# Patient Record
Sex: Female | Born: 1975 | Race: White | Hispanic: No | Marital: Married | Smoking: Never smoker
Health system: Southern US, Community
[De-identification: ages and names within clinical notes are randomized; demographics above are authoritative.]

## PROBLEM LIST (undated history)

## (undated) DIAGNOSIS — K219 Gastro-esophageal reflux disease without esophagitis: Secondary | ICD-10-CM

## (undated) HISTORY — PX: UPPER GI ENDOSCOPY: SHX6162

---

## 1998-02-06 ENCOUNTER — Inpatient Hospital Stay (HOSPITAL_COMMUNITY): Admission: AD | Admit: 1998-02-06 | Discharge: 1998-02-09 | Payer: Self-pay | Admitting: Obstetrics and Gynecology

## 1999-05-11 ENCOUNTER — Encounter: Payer: Self-pay | Admitting: Internal Medicine

## 1999-05-11 ENCOUNTER — Ambulatory Visit (HOSPITAL_COMMUNITY): Admission: RE | Admit: 1999-05-11 | Discharge: 1999-05-11 | Payer: Self-pay | Admitting: Internal Medicine

## 1999-06-07 ENCOUNTER — Encounter: Payer: Self-pay | Admitting: Gastroenterology

## 1999-06-07 ENCOUNTER — Ambulatory Visit (HOSPITAL_COMMUNITY): Admission: RE | Admit: 1999-06-07 | Discharge: 1999-06-07 | Payer: Self-pay | Admitting: Gastroenterology

## 1999-07-29 ENCOUNTER — Other Ambulatory Visit: Admission: RE | Admit: 1999-07-29 | Discharge: 1999-07-29 | Payer: Self-pay | Admitting: Obstetrics and Gynecology

## 2001-12-03 ENCOUNTER — Other Ambulatory Visit: Admission: RE | Admit: 2001-12-03 | Discharge: 2001-12-03 | Payer: Self-pay | Admitting: Family Medicine

## 2005-09-03 ENCOUNTER — Inpatient Hospital Stay: Payer: Self-pay | Admitting: Unknown Physician Specialty

## 2007-03-09 ENCOUNTER — Ambulatory Visit: Payer: Self-pay

## 2007-07-28 ENCOUNTER — Inpatient Hospital Stay: Payer: Self-pay | Admitting: Obstetrics and Gynecology

## 2007-11-15 HISTORY — PX: CHOLECYSTECTOMY: SHX55

## 2008-08-13 ENCOUNTER — Ambulatory Visit: Payer: Self-pay | Admitting: Family Medicine

## 2008-10-22 ENCOUNTER — Ambulatory Visit: Payer: Self-pay | Admitting: Gastroenterology

## 2008-10-27 ENCOUNTER — Ambulatory Visit: Payer: Self-pay | Admitting: Surgery

## 2008-10-29 ENCOUNTER — Ambulatory Visit: Payer: Self-pay | Admitting: Surgery

## 2009-06-07 ENCOUNTER — Emergency Department: Payer: Self-pay | Admitting: Emergency Medicine

## 2015-11-23 ENCOUNTER — Encounter: Admission: RE | Payer: Self-pay | Source: Ambulatory Visit

## 2015-11-23 ENCOUNTER — Ambulatory Visit: Admission: RE | Admit: 2015-11-23 | Payer: Self-pay | Source: Ambulatory Visit | Admitting: Gastroenterology

## 2015-11-23 HISTORY — DX: Gastro-esophageal reflux disease without esophagitis: K21.9

## 2015-11-23 SURGERY — EGD (ESOPHAGOGASTRODUODENOSCOPY)
Anesthesia: General

## 2017-03-16 ENCOUNTER — Encounter: Payer: Self-pay | Admitting: Certified Nurse Midwife

## 2017-04-11 ENCOUNTER — Encounter: Payer: Self-pay | Admitting: Certified Nurse Midwife

## 2017-04-11 ENCOUNTER — Ambulatory Visit (INDEPENDENT_AMBULATORY_CARE_PROVIDER_SITE_OTHER): Payer: BLUE CROSS/BLUE SHIELD | Admitting: Certified Nurse Midwife

## 2017-04-11 VITALS — BP 108/66 | HR 78 | Ht 61.0 in | Wt 180.0 lb

## 2017-04-11 DIAGNOSIS — Z13220 Encounter for screening for lipoid disorders: Secondary | ICD-10-CM | POA: Diagnosis not present

## 2017-04-11 DIAGNOSIS — Z131 Encounter for screening for diabetes mellitus: Secondary | ICD-10-CM

## 2017-04-11 DIAGNOSIS — Z124 Encounter for screening for malignant neoplasm of cervix: Secondary | ICD-10-CM

## 2017-04-11 DIAGNOSIS — Z1239 Encounter for other screening for malignant neoplasm of breast: Secondary | ICD-10-CM

## 2017-04-11 DIAGNOSIS — Z1231 Encounter for screening mammogram for malignant neoplasm of breast: Secondary | ICD-10-CM | POA: Diagnosis not present

## 2017-04-11 DIAGNOSIS — Z01419 Encounter for gynecological examination (general) (routine) without abnormal findings: Secondary | ICD-10-CM

## 2017-04-11 NOTE — Progress Notes (Signed)
Gynecology Annual Exam  PCP: Patient, No Pcp Per  Chief Complaint:  Chief Complaint  Patient presents with  . Gynecologic Exam    History of Present Illness: Terri Santos is a 41 y.o. [phone] who presents for a NP annual exam. The patient has no complaints today.  Her menses are regular, they occur every month, and they last 7-8 days. Her flow is moderate with 2 heavier days requiring pad change every 2 hours. She does not have intermenstrual bleeding. Her last menstrual period was 04/02/2017. She has dysmenorrhea x 1 day and takes Motrin 600 mgm x1 dose with relief. Last pap smear: 12/18/2006, results were NIL/neg. No history of abnormal Paps The patient is sexually active. She currently uses vasectomy for contraception. She does not have dyspareunia.  She has not been [facility] at [facility] since the delivery of her last baby in 2008.  Her past medical history is remarkable for cholelithiasis,  Cholecystectomy, and GERD.  The patient does perform occasional self breast exams. She has not had a recent mammogram and is eligible.  There is a family history of breast cancer in her MGGM and MGM Genetic testing has not been done.  There is no family history of ovarian cancer.   The patient denies smoking.  She reports drinking alcohol. She reports have 2 drinks per week.  She denies illegal drug use.  The patient does not exercise.   She does get adequate calcium in her diet.  She has not had a recent cholesterol screen and is interested in lab work.  The patient denies current symptoms of depression.    Review of Systems: Review of Systems  Constitutional: Negative for chills, fever and weight loss.  HENT: Negative for congestion, sinus pain and sore throat.   Eyes: Negative for blurred vision and pain.  Respiratory: Negative for hemoptysis, shortness of breath and wheezing.   Cardiovascular: Negative for chest pain, palpitations and leg swelling.  Gastrointestinal:  Positive for heartburn. Negative for abdominal pain, blood in stool, diarrhea, nausea and vomiting.  Genitourinary: Negative for dysuria, frequency, hematuria and urgency.       Positive for menorrhagia  Musculoskeletal: Negative for back pain, joint pain and myalgias.  Skin: Negative for itching and rash.  Neurological: Negative for dizziness, tingling and headaches.  Endo/Heme/Allergies: Negative for environmental allergies and polydipsia. Does not bruise/bleed easily.       Negative for hirsutism   Psychiatric/Behavioral: Negative for depression. The patient is not nervous/anxious and does not have insomnia.     Past Medical History:  Past Medical History:  Diagnosis Date  . GERD (gastroesophageal reflux disease)     Past Surgical History:  Past Surgical History:  Procedure Laterality Date  . CHOLECYSTECTOMY  2009  . UPPER GI ENDOSCOPY     x2    Family History:  Family History  Problem Relation Age of Onset  . Lung cancer Maternal Uncle 30       both maternal uncles ages 3330 & 3070  . Breast cancer Maternal Grandmother 2280       also MGGM same age  . Diabetes Father     Social History:  Social History   Social History  . Marital status: Married    Spouse name: N/A  . Number of children: 7  . Years of education: N/A   Occupational History  . Homemaker    Social History Main Topics  . Smoking status: Never Smoker  . Smokeless tobacco: Never Used  .  Alcohol use 1.2 oz/week    2 Glasses of wine per week  . Drug use: No  . Sexual activity: Yes    Partners: Male    Birth control/ protection: Other-see comments     Comment: vasectomy   Other Topics Concern  . Not on file   Social History Narrative  . No narrative on file    Allergies:  No Known Allergies  Medications: Prior to Admission medications   Medication Sig Start Date End Date Taking? Authorizing Provider  Pseudoephedrine-APAP-DM (DAYQUIL MULTI-SYMPTOM COLD/FLU PO) Take by mouth.   Yes [provider]  Prilosec prn  Physical Exam Vitals: Blood pressure 108/66, pulse 78, height 5\' 1"  (1.549 m), weight 180 lb (81.6 kg),BMI 34.1 kg/m2, last menstrual period 04/02/2017.  General: WF in NAD HEENT: normocephalic, anicteric Neck: no thyroid enlargement, no palpable nodules, no cervical lymphadenopathy  Pulmonary: No increased work of breathing, CTAB Cardiovascular: RRR, without murmur  Breast: Breast symmetrical, no tenderness, no palpable nodules or masses, no skin or nipple retraction present, no nipple discharge.  No axillary, infraclavicular or supraclavicular lymphadenopathy. Abdomen: Soft, non-tender, non-distended.  Umbilicus without lesions.  No hepatomegaly or masses palpable. No evidence of hernia. Genitourinary:  External: Normal external female genitalia.  Normal urethral meatus, normal Bartholin's and Skene's glands.    Vagina: Normal vaginal mucosa, no evidence of prolapse.    Cervix: Grossly normal in appearance, no bleeding, non-tender  Uterus: Anteverted, normal size, shape, and consistency, mobile, and non-tender  Adnexa: No adnexal masses, non-tender  Rectal: deferred  Lymphatic: no evidence of inguinal lymphadenopathy Extremities: no edema, erythema, or tenderness Neurologic: Grossly intact Psychiatric: mood appropriate, affect full     Assessment: 41 y.o. [id number] with normal well woman exam  Plan:  1) Breast cancer screening - recommend monthly self breast exam and annual screening mammograms. Mammogram was ordered today.  2) Cervical cancer screening - Pap was done.   3) Routine healthcare maintenance including cholesterol and diabetes screening ordered today   4) RTO in 1 year and prn  Farrel Conners, CNM

## 2017-04-12 ENCOUNTER — Encounter: Payer: Self-pay | Admitting: Certified Nurse Midwife

## 2017-04-12 LAB — HGB A1C W/O EAG: HEMOGLOBIN A1C: 5.3 % (ref 4.8–5.6)

## 2017-04-12 LAB — LIPID PANEL WITH LDL/HDL RATIO
Cholesterol, Total: 167 mg/dL (ref 100–199)
HDL: 40 mg/dL (ref >39)
LDL Calculated: 102 mg/dL — ABNORMAL HIGH (ref 0–99)
LDL/HDL RATIO: 2.6 ratio (ref 0.0–3.2)
Triglycerides: 125 mg/dL (ref 0–149)
VLDL Cholesterol Cal: 25 mg/dL (ref 5–40)

## 2017-04-14 LAB — IGP, APTIMA HPV
HPV APTIMA: NEGATIVE
PAP Smear Comment: 0

## 2017-04-15 ENCOUNTER — Other Ambulatory Visit: Payer: Self-pay | Admitting: Certified Nurse Midwife

## 2017-04-16 ENCOUNTER — Encounter: Payer: Self-pay | Admitting: Certified Nurse Midwife

## 2017-04-16 DIAGNOSIS — K219 Gastro-esophageal reflux disease without esophagitis: Secondary | ICD-10-CM | POA: Insufficient documentation

## 2020-06-30 ENCOUNTER — Other Ambulatory Visit
Admission: RE | Admit: 2020-06-30 | Discharge: 2020-06-30 | Disposition: A | Discharge status: Home / Self Care | Payer: BC Managed Care – PPO | Source: Ambulatory Visit | Attending: Pediatrics | Admitting: Pediatrics

## 2020-06-30 DIAGNOSIS — R0600 Dyspnea, unspecified: Secondary | ICD-10-CM | POA: Diagnosis present

## 2020-06-30 DIAGNOSIS — R079 Chest pain, unspecified: Secondary | ICD-10-CM | POA: Insufficient documentation

## 2020-06-30 LAB — FIBRIN DERIVATIVES D-DIMER (ARMC ONLY): Fibrin derivatives D-dimer (ARMC): 224.34 ng/mL (FEU) (ref 0.00–499.00)

## 2020-08-11 ENCOUNTER — Other Ambulatory Visit: Payer: Self-pay | Admitting: Family Medicine

## 2020-08-11 DIAGNOSIS — Z1231 Encounter for screening mammogram for malignant neoplasm of breast: Secondary | ICD-10-CM

## 2020-08-17 ENCOUNTER — Other Ambulatory Visit: Payer: Self-pay | Admitting: Family Medicine

## 2020-08-17 DIAGNOSIS — Z803 Family history of malignant neoplasm of breast: Secondary | ICD-10-CM

## 2020-08-17 DIAGNOSIS — R079 Chest pain, unspecified: Secondary | ICD-10-CM

## 2020-08-25 ENCOUNTER — Encounter (INDEPENDENT_AMBULATORY_CARE_PROVIDER_SITE_OTHER): Payer: Self-pay

## 2020-08-25 ENCOUNTER — Other Ambulatory Visit: Payer: Self-pay

## 2020-08-25 ENCOUNTER — Ambulatory Visit
Admission: RE | Admit: 2020-08-25 | Discharge: 2020-08-25 | Disposition: A | Discharge status: Home / Self Care | Payer: BC Managed Care – PPO | Source: Ambulatory Visit | Attending: Family Medicine | Admitting: Family Medicine

## 2020-08-25 DIAGNOSIS — Z803 Family history of malignant neoplasm of breast: Secondary | ICD-10-CM | POA: Diagnosis present

## 2020-08-25 DIAGNOSIS — R079 Chest pain, unspecified: Secondary | ICD-10-CM | POA: Diagnosis not present

## 2020-09-10 ENCOUNTER — Other Ambulatory Visit: Payer: Self-pay

## 2020-09-10 ENCOUNTER — Ambulatory Visit
Admission: RE | Admit: 2020-09-10 | Discharge: 2020-09-10 | Disposition: A | Discharge status: Home / Self Care | Payer: BC Managed Care – PPO | Source: Ambulatory Visit | Attending: Family Medicine | Admitting: Family Medicine

## 2020-09-10 DIAGNOSIS — Z1231 Encounter for screening mammogram for malignant neoplasm of breast: Secondary | ICD-10-CM | POA: Insufficient documentation

## 2020-09-14 ENCOUNTER — Other Ambulatory Visit: Payer: Self-pay | Admitting: Family Medicine

## 2020-09-16 ENCOUNTER — Other Ambulatory Visit: Payer: Self-pay | Admitting: Family Medicine

## 2020-09-16 DIAGNOSIS — N6320 Unspecified lump in the left breast, unspecified quadrant: Secondary | ICD-10-CM

## 2020-09-16 DIAGNOSIS — R928 Other abnormal and inconclusive findings on diagnostic imaging of breast: Secondary | ICD-10-CM

## 2020-09-16 DIAGNOSIS — N6489 Other specified disorders of breast: Secondary | ICD-10-CM

## 2020-09-18 ENCOUNTER — Ambulatory Visit
Admission: RE | Admit: 2020-09-18 | Discharge: 2020-09-18 | Disposition: A | Discharge status: Home / Self Care | Payer: BC Managed Care – PPO | Source: Ambulatory Visit | Attending: Family Medicine | Admitting: Family Medicine

## 2020-09-18 ENCOUNTER — Other Ambulatory Visit: Payer: Self-pay

## 2020-09-18 DIAGNOSIS — N6489 Other specified disorders of breast: Secondary | ICD-10-CM | POA: Diagnosis present

## 2020-09-18 DIAGNOSIS — R928 Other abnormal and inconclusive findings on diagnostic imaging of breast: Secondary | ICD-10-CM

## 2020-09-18 DIAGNOSIS — N6320 Unspecified lump in the left breast, unspecified quadrant: Secondary | ICD-10-CM

## 2020-09-21 ENCOUNTER — Other Ambulatory Visit: Payer: Self-pay | Admitting: Family Medicine

## 2020-09-21 DIAGNOSIS — N6320 Unspecified lump in the left breast, unspecified quadrant: Secondary | ICD-10-CM

## 2020-09-21 DIAGNOSIS — R928 Other abnormal and inconclusive findings on diagnostic imaging of breast: Secondary | ICD-10-CM

## 2020-09-28 ENCOUNTER — Ambulatory Visit
Admission: RE | Admit: 2020-09-28 | Discharge: 2020-09-28 | Disposition: A | Discharge status: Home / Self Care | Payer: BC Managed Care – PPO | Source: Ambulatory Visit | Attending: Family Medicine | Admitting: Family Medicine

## 2020-09-28 ENCOUNTER — Other Ambulatory Visit: Payer: Self-pay

## 2020-09-28 DIAGNOSIS — R928 Other abnormal and inconclusive findings on diagnostic imaging of breast: Secondary | ICD-10-CM | POA: Diagnosis present

## 2020-09-28 DIAGNOSIS — N6320 Unspecified lump in the left breast, unspecified quadrant: Secondary | ICD-10-CM | POA: Diagnosis present

## 2020-09-28 HISTORY — PX: BREAST BIOPSY: SHX20

## 2020-09-29 LAB — SURGICAL PATHOLOGY

## 2021-08-29 IMAGING — MG MM BREAST LOCALIZATION CLIP
4 series · 4 of 12 positions shown · non-contrast
Comparison: Previous exam(s).

CLINICAL DATA: Patient status post ultrasound-guided core needle
biopsy left breast mass.

EXAM:
DIAGNOSTIC LEFT MAMMOGRAM POST ULTRASOUND BIOPSY

[L ML synth-2D]
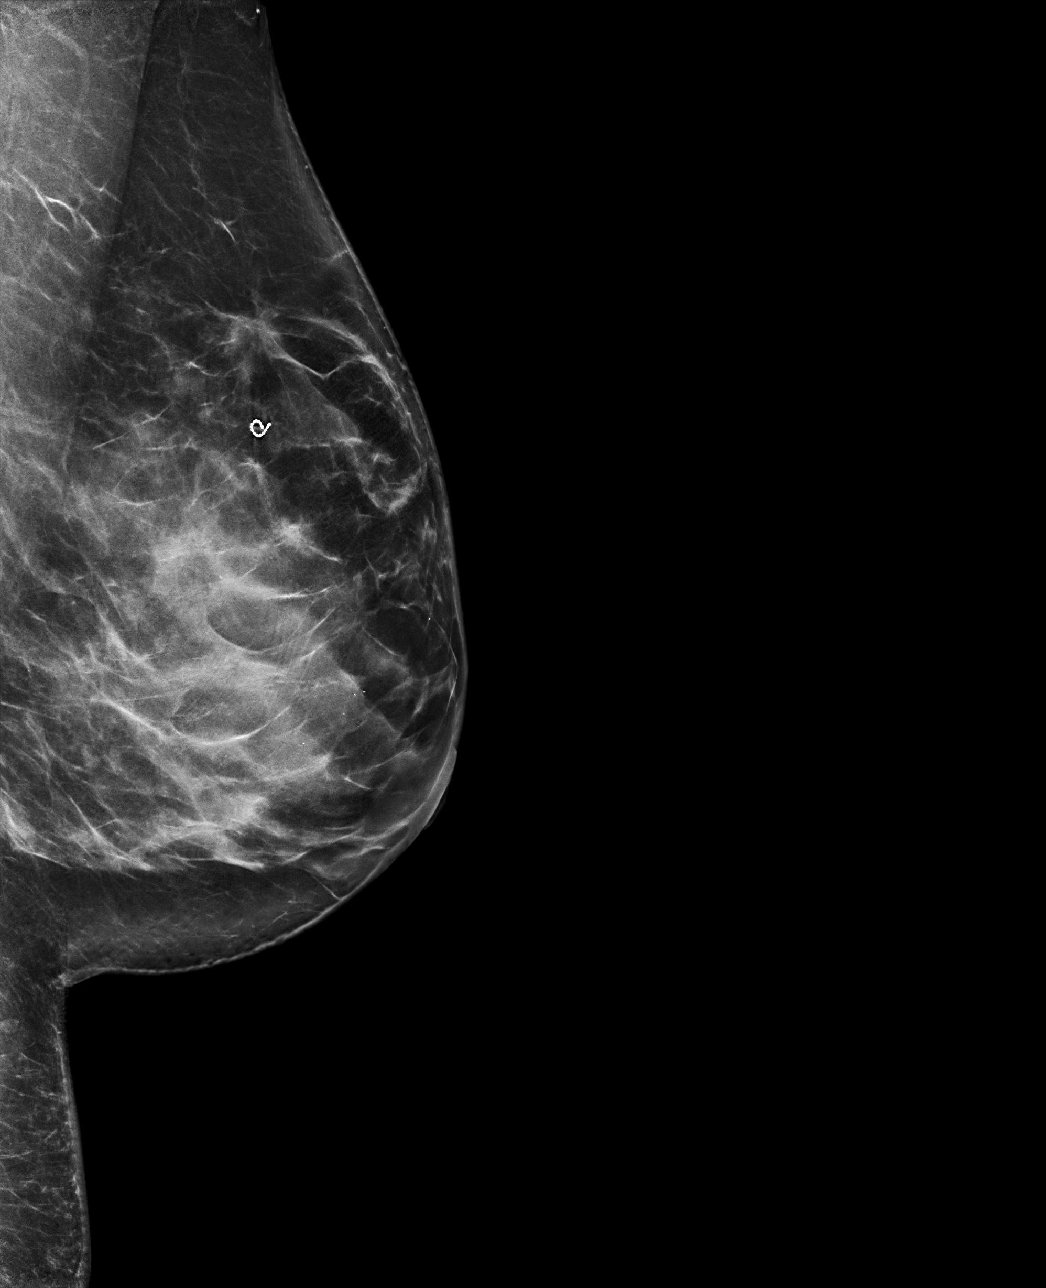

[L CC synth-2D]
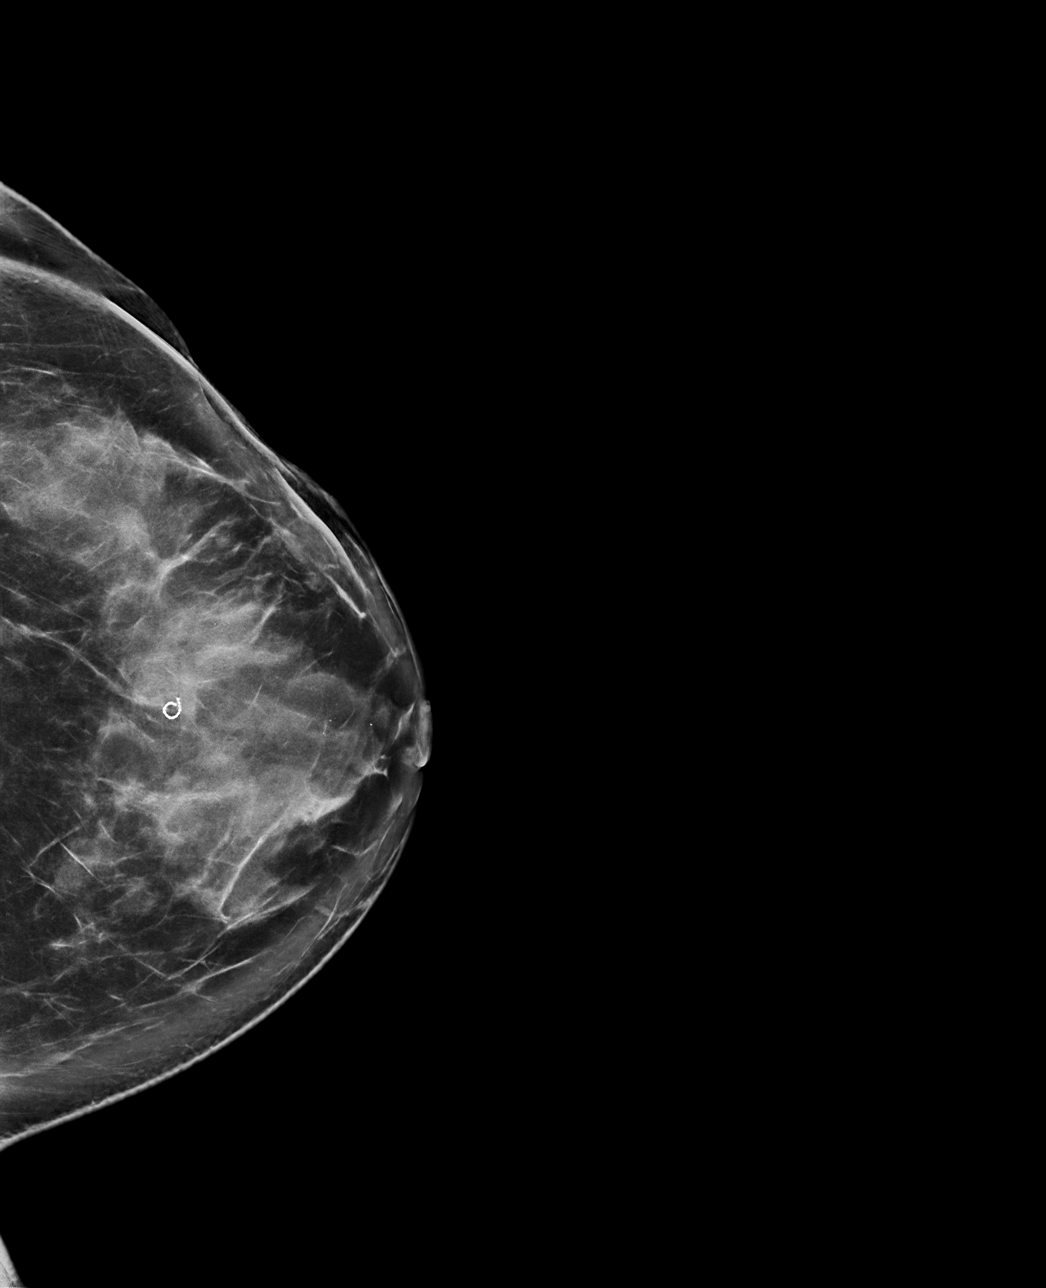

[L ML tomo · tomo slice 47/94.0]
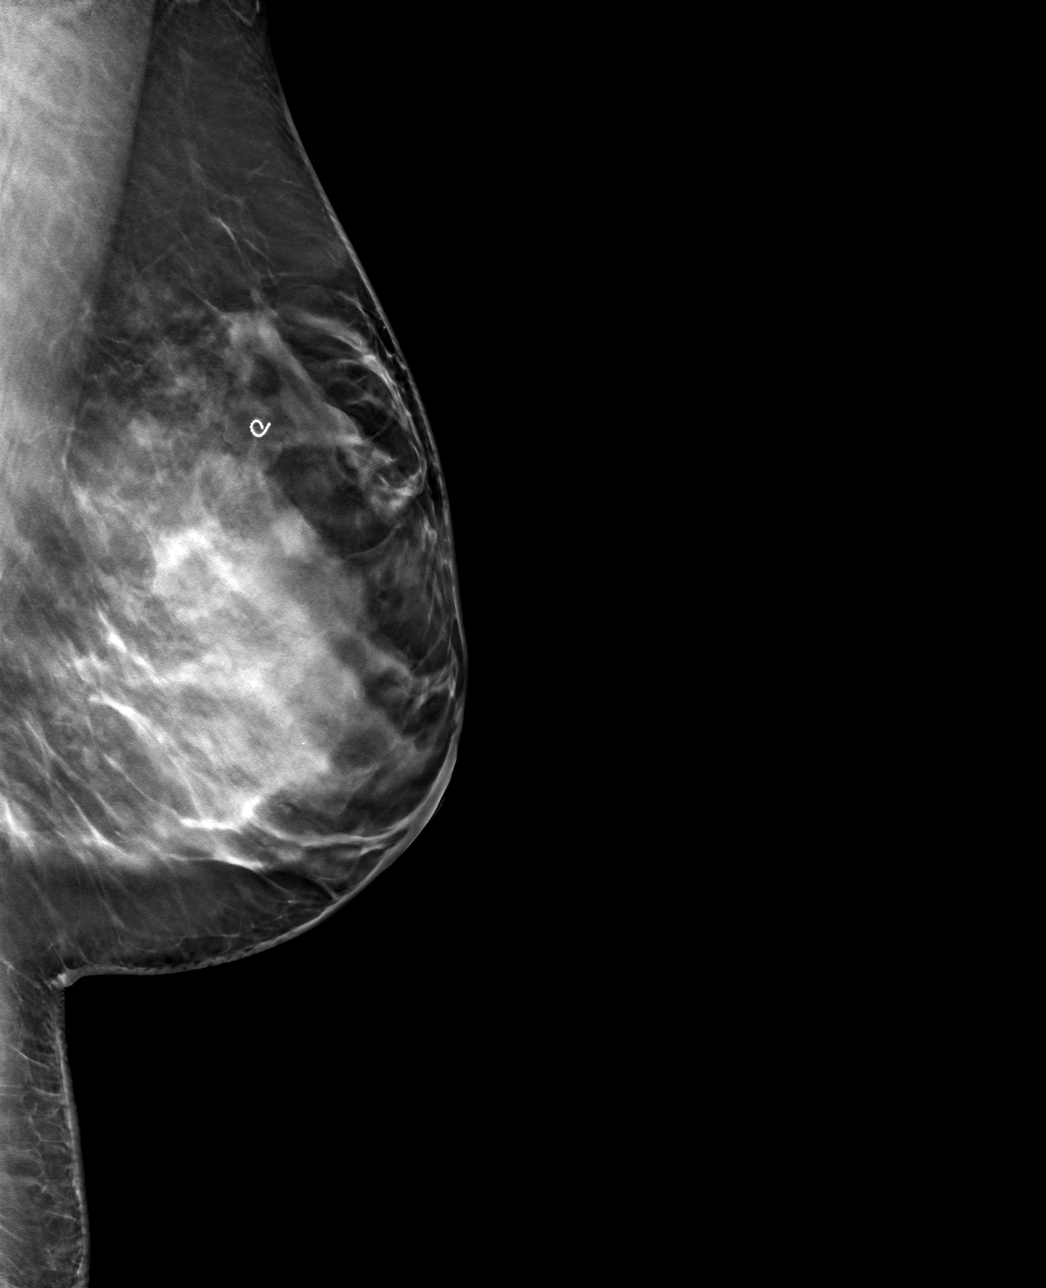

[L CC tomo · tomo slice 52/103.0]
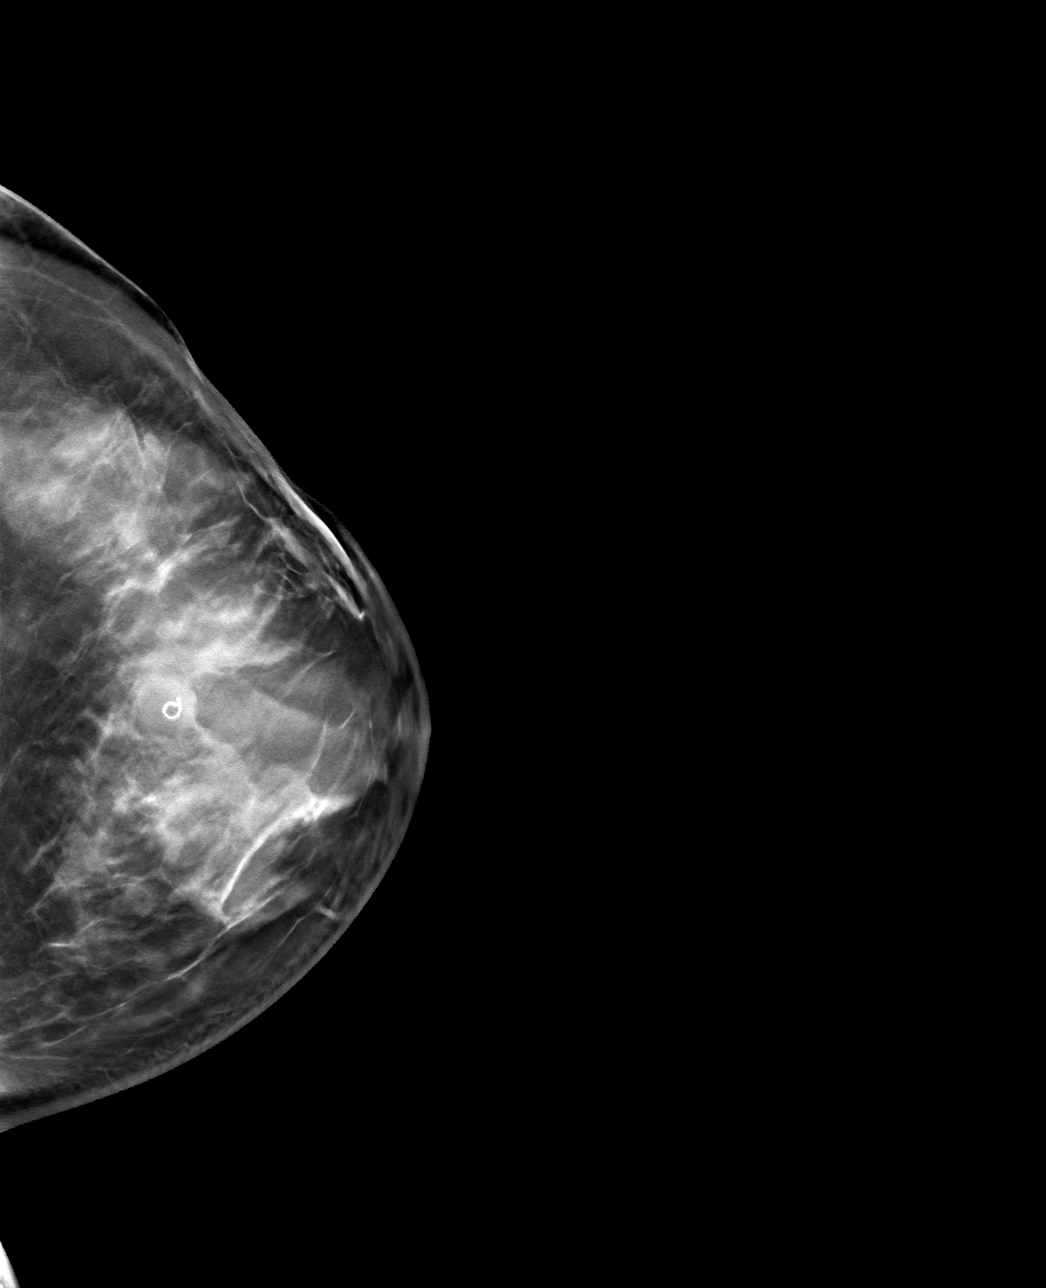

[4 of 12 positions shown; findings below may reference images not displayed]

FINDINGS: Mammographic images were obtained following ultrasound guided biopsy
of left breast mass 12 o'clock position. The biopsy marking clip is
in expected position at the site of biopsy.
IMPRESSION: Appropriate positioning of the Q shaped biopsy marking clip at the
site of biopsy in the left breast mass 12 o'clock position.

Final Assessment: Post Procedure Mammograms for Marker Placement

## 2022-01-12 ENCOUNTER — Other Ambulatory Visit: Payer: Self-pay | Admitting: Physical Medicine & Rehabilitation

## 2022-01-12 DIAGNOSIS — M5442 Lumbago with sciatica, left side: Secondary | ICD-10-CM

## 2022-01-13 ENCOUNTER — Ambulatory Visit
Admission: RE | Admit: 2022-01-13 | Discharge: 2022-01-13 | Disposition: A | Discharge status: Home / Self Care | Payer: BC Managed Care – PPO | Source: Ambulatory Visit | Attending: Physical Medicine & Rehabilitation | Admitting: Physical Medicine & Rehabilitation

## 2022-01-13 ENCOUNTER — Other Ambulatory Visit: Payer: Self-pay

## 2022-01-13 DIAGNOSIS — M5442 Lumbago with sciatica, left side: Secondary | ICD-10-CM | POA: Diagnosis present

## 2023-07-25 ENCOUNTER — Other Ambulatory Visit: Payer: Self-pay | Admitting: Obstetrics and Gynecology

## 2023-07-25 DIAGNOSIS — Z1231 Encounter for screening mammogram for malignant neoplasm of breast: Secondary | ICD-10-CM

## 2023-08-01 ENCOUNTER — Other Ambulatory Visit: Payer: Self-pay | Admitting: Certified Nurse Midwife

## 2023-08-01 DIAGNOSIS — Z1231 Encounter for screening mammogram for malignant neoplasm of breast: Secondary | ICD-10-CM

## 2023-08-16 ENCOUNTER — Ambulatory Visit
Admission: RE | Admit: 2023-08-16 | Discharge: 2023-08-16 | Disposition: A | Discharge status: Home / Self Care | Payer: Medicaid Other | Source: Ambulatory Visit | Attending: Certified Nurse Midwife | Admitting: Certified Nurse Midwife

## 2023-08-16 DIAGNOSIS — Z1231 Encounter for screening mammogram for malignant neoplasm of breast: Secondary | ICD-10-CM | POA: Insufficient documentation

## 2023-09-04 ENCOUNTER — Telehealth: Payer: Self-pay | Admitting: Physician Assistant

## 2023-09-04 ENCOUNTER — Encounter: Payer: Self-pay | Admitting: Physician Assistant

## 2023-09-04 ENCOUNTER — Ambulatory Visit: Payer: Medicaid Other | Admitting: Physician Assistant

## 2023-09-04 VITALS — BP 136/90 | HR 80 | Temp 97.9°F | Resp 16 | Ht 61.0 in | Wt 180.0 lb

## 2023-09-04 DIAGNOSIS — R030 Elevated blood-pressure reading, without diagnosis of hypertension: Secondary | ICD-10-CM | POA: Diagnosis not present

## 2023-09-04 DIAGNOSIS — R0789 Other chest pain: Secondary | ICD-10-CM | POA: Diagnosis not present

## 2023-09-04 DIAGNOSIS — K219 Gastro-esophageal reflux disease without esophagitis: Secondary | ICD-10-CM

## 2023-09-04 DIAGNOSIS — Z7689 Persons encountering health services in other specified circumstances: Secondary | ICD-10-CM

## 2023-09-04 NOTE — Telephone Encounter (Signed)
Patient will call back to schedule today's follow up-Toni

## 2023-09-04 NOTE — Progress Notes (Signed)
[facility] [address] [place], [place] [address]  Internal MEDICINE  Office Visit Note  Patient Name: Terri Santos  [id number]  [id number]  Date of Service: 09/13/2023   Complaints/HPI Pt is here for establishment of PCP. Chief Complaint  Patient presents with   New Patient (Initial Visit)   HPI Pt is here to establish care -Has not had a PCP in a long time -Does see GYN and pap is up to date, mammogram done in Oct -Will have some CP on right side for years. Has been going on for many years and had a CT chest in 2021 for this that she states did not reveal anything. Does not radiate anywhere and comes and goes with no specific trigger. Denies any current pain. Discussed if new or worsening symptoms arise, including CP that does not go away as it typically does then to go to ED -Does have stress, not interested in meds at this time. 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   [DISCONTINUED] Pseudoephedrine-APAP-DM (DAYQUIL MULTI-SYMPTOM COLD/FLU PO) Take by mouth.   No facility-administered encounter medications on file as of 09/04/2023.    Surgical History: Past Surgical History:  Procedure Laterality Date   BREAST BIOPSY Left 09/28/2020   [place] bx/ q clip/ - FIBROADENOMA   CHOLECYSTECTOMY  2009   UPPER GI ENDOSCOPY     x2    Medical History: Past Medical History:  Diagnosis Date   GERD (gastroesophageal reflux  disease)     Family History: Family History  Problem Relation Age of Onset   Breast cancer Mother 40   Diabetes Father    Lung cancer Maternal Uncle 92       both maternal uncles ages 16 & 62   Breast cancer Maternal Grandmother 43       also MGGM same age   Breast cancer Other        mggm    Social History   Socioeconomic History   Marital status: Married    Spouse name: Not on file   Number of children: 7   Years of education: Not on file   Highest education level: Not on file  Occupational History   Occupation: Homemaker  Tobacco Use   Smoking status: Never   Smokeless tobacco: Never  Vaping Use   Vaping status: Never Used  Substance and Sexual Activity   Alcohol use: Yes    Alcohol/week: 2.0 standard drinks of alcohol    Types: 2 Glasses of wine per week   Drug use: No   Sexual activity: Yes    Partners: Male    Birth control/protection: Other-see comments    Comment: vasectomy  Other Topics Concern   Not on file  Social History Narrative   Not on file   Social Determinants of Health   Financial Resource Strain: Not on file  Food Insecurity: Not on file  Transportation Needs: Not on file  Physical Activity: Not on  file  Stress: Not on file  Social Connections: Not on file  Intimate Partner Violence: Not on file     Review of Systems  Constitutional:  Negative for chills, fatigue and unexpected weight change.  HENT:  Positive for postnasal drip. Negative for congestion, rhinorrhea, sneezing and sore throat.   Eyes:  Negative for redness.  Respiratory:  Negative for cough, chest tightness and shortness of breath.   Cardiovascular:  Negative for palpitations.  Gastrointestinal:  Negative for abdominal pain, constipation, diarrhea, nausea and vomiting.  Genitourinary:  Negative for dysuria and frequency.  Musculoskeletal:  Negative for arthralgias, back pain, joint swelling and neck pain.  Skin:  Negative for rash.  Neurological: Negative.  Negative  for tremors and numbness.  Hematological:  Negative for adenopathy. Does not bruise/bleed easily.  Psychiatric/Behavioral:  Negative for behavioral problems (Depression), sleep disturbance and suicidal ideas. The patient is not nervous/anxious.     Vital Signs: BP (!) 136/90 Comment: 135/100  Pulse 80   Temp 97.9 F (36.6 C)   Resp 16   Ht 5\' 1"  (1.549 m)   Wt 180 lb (81.6 kg)   LMP 08/11/2023   SpO2 98%   BMI 34.01 kg/m    Physical Exam Vitals and nursing note reviewed.  Constitutional:      General: She is not in acute distress.    Appearance: She is well-developed. She is not diaphoretic.  HENT:     Head: Normocephalic and atraumatic.     Mouth/Throat:     Pharynx: No oropharyngeal exudate.  Eyes:     Pupils: Pupils are equal, round, and reactive to light.  Neck:     Thyroid: No thyromegaly.     Vascular: No JVD.     Trachea: No tracheal deviation.  Cardiovascular:     Rate and Rhythm: Normal rate and regular rhythm.     Heart sounds: Normal heart sounds. No murmur heard.    No friction rub. No gallop.  Pulmonary:     Effort: Pulmonary effort is normal. No respiratory distress.     Breath sounds: No wheezing or rales.  Chest:     Chest wall: No tenderness.  Abdominal:     General: Bowel sounds are normal.     Palpations: Abdomen is soft.  Musculoskeletal:        General: Normal range of motion.     Cervical back: Normal range of motion and neck supple.  Lymphadenopathy:     Cervical: No cervical adenopathy.  Skin:    General: Skin is warm and dry.  Neurological:     Mental Status: She is alert and oriented to person, place, and time.     Cranial Nerves: No cranial nerve deficit.  Psychiatric:        Behavior: Behavior normal.        Thought Content: Thought content normal.        Judgment: Judgment normal.       Assessment/Plan: 1. Elevated BP without diagnosis of hypertension Will start monitoring BP at home  2. Intermittent right-sided chest  pain Ongoing for years without change and with previous workup unrevealing. Will try to ID triggers such as reflux and/or stress. Advised if new or worsening pain to go to ED  3. Gastroesophageal reflux disease, unspecified whether esophagitis present Continue omeprazole  4. Encounter to establish care Pt establishing care   General Counseling: Melrose verbalizes understanding of the findings of todays visit and agrees with plan of treatment. I have discussed  any further diagnostic evaluation that may be needed or ordered today. We also reviewed her medications today. she has been encouraged to call the office with any questions or concerns that should arise related to todays visit.    Counseling:    No orders of the defined types were placed in this encounter.   No orders of the defined types were placed in this encounter.    This patient was seen by Lynn Ito, PA-C in collaboration with Dr. Beverely Risen as a part of collaborative care agreement.   Time spent:35 Minutes

## 2023-09-18 ENCOUNTER — Ambulatory Visit: Payer: Medicaid Other | Admitting: Physician Assistant

## 2024-05-10 ENCOUNTER — Ambulatory Visit: Admitting: Physician Assistant

## 2024-05-23 ENCOUNTER — Ambulatory Visit: Admitting: Physician Assistant

## 2024-09-12 ENCOUNTER — Telehealth: Payer: Self-pay | Admitting: Physician Assistant

## 2024-09-12 ENCOUNTER — Ambulatory Visit: Admitting: Physician Assistant

## 2024-09-12 ENCOUNTER — Encounter: Payer: Self-pay | Admitting: Physician Assistant

## 2024-09-12 VITALS — BP 112/70 | HR 80 | Temp 98.0°F | Resp 16 | Ht 61.0 in | Wt 182.0 lb

## 2024-09-12 DIAGNOSIS — Z0001 Encounter for general adult medical examination with abnormal findings: Secondary | ICD-10-CM | POA: Diagnosis not present

## 2024-09-12 DIAGNOSIS — Z1329 Encounter for screening for other suspected endocrine disorder: Secondary | ICD-10-CM

## 2024-09-12 DIAGNOSIS — E611 Iron deficiency: Secondary | ICD-10-CM

## 2024-09-12 DIAGNOSIS — M545 Low back pain, unspecified: Secondary | ICD-10-CM

## 2024-09-12 DIAGNOSIS — R300 Dysuria: Secondary | ICD-10-CM

## 2024-09-12 DIAGNOSIS — E782 Mixed hyperlipidemia: Secondary | ICD-10-CM

## 2024-09-12 DIAGNOSIS — E538 Deficiency of other specified B group vitamins: Secondary | ICD-10-CM

## 2024-09-12 DIAGNOSIS — E559 Vitamin D deficiency, unspecified: Secondary | ICD-10-CM

## 2024-09-12 DIAGNOSIS — R5383 Other fatigue: Secondary | ICD-10-CM

## 2024-09-12 DIAGNOSIS — G8929 Other chronic pain: Secondary | ICD-10-CM

## 2024-09-12 MED ORDER — MELOXICAM 15 MG PO TABS: 15.0000 mg | ORAL_TABLET | ORAL | 1 refills | Status: AC | PRN

## 2024-09-12 NOTE — Progress Notes (Signed)
 [facility] [address] [place], [place] [address]  Internal MEDICINE  Office Visit Note  Patient Name: Terri Santos  [id number]  [id number]  Date of Service: 09/12/2024  Chief Complaint  Patient presents with   Annual Exam   Gastroesophageal Reflux   Quality Metric Gaps    Colonoscopy     HPI Pt is here for routine health maintenance examination -Takes meloxicam at times due to physical job. Previously had steroid injections in spine for issues with nerve pain that worked well and continues to do well with just using mobic 2-3 times per week as needed -will look into colonoscopy once insurance sorted out. States she had this a long time ago -GYN orders her mammogram and believes she is due; last pap normal on 04/18/22 with GYN as well -insurance coverage through tomorrow and is working out a leisure centre manager. Wants to get labs done prior and will go tomorrow AM for this. -would like to recheck iron, no longer having abnormal bleeding so hopes this is better  Current Medication: Outpatient Encounter Medications as of 09/12/2024  Medication Sig   omeprazole (PRILOSEC) 20 MG capsule Take 20 mg by mouth daily.   [DISCONTINUED] meloxicam (MOBIC) 15 MG tablet Take 15 mg by mouth as needed for pain.   meloxicam (MOBIC) 15 MG tablet Take 1 tablet (15 mg total) by mouth as needed for pain.   No facility-administered encounter medications on file as of 09/12/2024.    Surgical History: Past Surgical History:  Procedure Laterality Date   BREAST BIOPSY Left 09/28/2020   [place]  bx/ q clip/ - FIBROADENOMA   CHOLECYSTECTOMY  2009   UPPER GI ENDOSCOPY     x2    Medical History: Past Medical History:  Diagnosis Date   GERD (gastroesophageal reflux disease)     Family History: Family History  Problem Relation Age of Onset   Breast cancer Mother 65   Diabetes Father    Lung cancer Maternal Uncle 36       both maternal uncles ages 44 & 1   Breast cancer Maternal  Grandmother 33       also MGGM same age   Breast cancer Other        mggm      Review of Systems  Constitutional:  Negative for chills, fatigue and unexpected weight change.  HENT:  Positive for postnasal drip. Negative for congestion, rhinorrhea, sneezing and sore throat.   Eyes:  Negative for redness.  Respiratory:  Negative for cough, chest tightness and shortness of breath.   Cardiovascular:  Negative for palpitations.  Gastrointestinal:  Negative for abdominal pain, constipation, diarrhea, nausea and vomiting.  Genitourinary:  Negative for dysuria and frequency.  Musculoskeletal:  Positive for back pain. Negative for arthralgias, joint swelling and neck pain.  Skin:  Negative for rash.  Neurological: Negative.  Negative for tremors and numbness.  Hematological:  Negative for adenopathy. Does not bruise/bleed easily.  Psychiatric/Behavioral:  Negative for behavioral problems (Depression), sleep disturbance and suicidal ideas. The patient is not nervous/anxious.      Vital Signs: BP 112/70   Pulse 80   Temp 98 F (36.7 C)   Resp 16   Ht 5' 1 (1.549 m)   Wt 182 lb (82.6 kg)   SpO2 99%   BMI 34.39 kg/m    Physical Exam Vitals and nursing note reviewed.  Constitutional:      General: She is not in acute distress.    Appearance: She is  well-developed. She is not diaphoretic.  HENT:     Head: Normocephalic and atraumatic.     Mouth/Throat:     Pharynx: No oropharyngeal exudate.  Eyes:     Pupils: Pupils are equal, round, and reactive to light.  Neck:     Thyroid: No thyromegaly.     Vascular: No JVD.     Trachea: No tracheal deviation.  Cardiovascular:     Rate and Rhythm: Normal rate and regular rhythm.     Heart sounds: Normal heart sounds. No murmur heard.    No friction rub. No gallop.  Pulmonary:     Effort: Pulmonary effort is normal. No respiratory distress.     Breath sounds: No wheezing or rales.  Chest:     Chest wall: No tenderness.  Abdominal:      General: Bowel sounds are normal.     Palpations: Abdomen is soft.     Tenderness: There is no abdominal tenderness.  Musculoskeletal:        General: Normal range of motion.     Cervical back: Normal range of motion and neck supple.  Lymphadenopathy:     Cervical: No cervical adenopathy.  Skin:    General: Skin is warm and dry.  Neurological:     Mental Status: She is alert and oriented to person, place, and time.     Cranial Nerves: No cranial nerve deficit.  Psychiatric:        Behavior: Behavior normal.        Thought Content: Thought content normal.        Judgment: Judgment normal.      LABS: No results found for this or any previous visit (from the past 2160 hours).      Assessment/Plan: 1. Encounter for general adult medical examination with abnormal findings (Primary) CPE performed, lab slip given, due for colon screening and mammogram but is awaiting insurance change  2. Chronic bilateral low back pain, unspecified whether sciatica present May use mobic prn  3. Mixed hyperlipidemia - Lipid Panel With LDL/HDL Ratio  4. Iron deficiency Will recheck labs - Fe+TIBC+Fer  5. B12 deficiency - B12 and Folate Panel  6. Vitamin D deficiency - VITAMIN D 25 Hydroxy (Vit-D Deficiency, Fractures)  7. Thyroid disorder screen - TSH + free T4  8. Other fatigue - CBC w/Diff/Platelet - Comprehensive metabolic panel with GFR - TSH + free T4 - Lipid Panel With LDL/HDL Ratio - Fe+TIBC+Fer - A87 and Folate Panel - VITAMIN D 25 Hydroxy (Vit-D Deficiency, Fractures)  9. Dysuria - UA/M w/rflx Culture, Routine   General Counseling: Mirta verbalizes understanding of the findings of todays visit and agrees with plan of treatment. I have discussed any further diagnostic evaluation that may be needed or ordered today. We also reviewed her medications today. she has been encouraged to call the office with any questions or concerns that should arise related to todays  visit.    Counseling:    Orders Placed This Encounter  Procedures   UA/M w/rflx Culture, Routine   CBC w/Diff/Platelet   Comprehensive metabolic panel with GFR   TSH + free T4   Lipid Panel With LDL/HDL Ratio   Fe+TIBC+Fer   A87 and Folate Panel   VITAMIN D 25 Hydroxy (Vit-D Deficiency, Fractures)    Meds ordered this encounter  Medications   meloxicam (MOBIC) 15 MG tablet    Sig: Take 1 tablet (15 mg total) by mouth as needed for pain.    Dispense:  90  tablet    Refill:  1    This patient was seen by Tinnie Pro, PA-C in collaboration with Dr. Sigrid Bathe as a part of collaborative care agreement.  Total time spent:35 Minutes  Time spent includes review of chart, medications, test results, and follow up plan with the patient.     Sigrid CHRISTELLA Bathe, MD  Internal Medicine

## 2024-09-12 NOTE — Telephone Encounter (Signed)
 Patient will call back to schedule today's 6 mo f/u. Forgot to bring her calendar-toni

## 2024-09-13 LAB — MICROSCOPIC EXAMINATION
Bacteria, UA: NONE SEEN
Casts: NONE SEEN /LPF
Epithelial Cells (non renal): NONE SEEN /HPF (ref 0–10)
RBC, Urine: NONE SEEN /HPF (ref 0–2)
WBC, UA: NONE SEEN /HPF (ref 0–5)

## 2024-09-13 LAB — UA/M W/RFLX CULTURE, ROUTINE
Bilirubin, UA: NEGATIVE
Glucose, UA: NEGATIVE
Ketones, UA: NEGATIVE
Leukocytes,UA: NEGATIVE
Nitrite, UA: NEGATIVE
Protein,UA: NEGATIVE
RBC, UA: NEGATIVE
Specific Gravity, UA: 1.01 (ref 1.005–1.030)
Urobilinogen, Ur: 0.2 mg/dL (ref 0.2–1.0)
pH, UA: 5 (ref 5.0–7.5)

## 2024-09-14 LAB — COMPREHENSIVE METABOLIC PANEL WITH GFR
ALT: 17 IU/L (ref 0–32)
AST: 19 IU/L (ref 0–40)
Albumin: 4.5 g/dL (ref 3.9–4.9)
Alkaline Phosphatase: 61 IU/L (ref 41–116)
BUN/Creatinine Ratio: 19 (ref 9–23)
BUN: 17 mg/dL (ref 6–24)
Bilirubin Total: 0.4 mg/dL (ref 0.0–1.2)
CO2: 21 mmol/L (ref 20–29)
Calcium: 9.3 mg/dL (ref 8.7–10.2)
Chloride: 103 mmol/L (ref 96–106)
Creatinine, Ser: 0.88 mg/dL (ref 0.57–1.00)
Globulin, Total: 2.7 g/dL (ref 1.5–4.5)
Glucose: 92 mg/dL (ref 70–99)
Potassium: 4.8 mmol/L (ref 3.5–5.2)
Sodium: 138 mmol/L (ref 134–144)
Total Protein: 7.2 g/dL (ref 6.0–8.5)
eGFR: 82 mL/min/1.73 (ref >59)

## 2024-09-14 LAB — CBC WITH DIFFERENTIAL/PLATELET
Basophils Absolute: 0.1 x10E3/uL (ref 0.0–0.2)
Basos: 1 %
EOS (ABSOLUTE): 0.3 x10E3/uL (ref 0.0–0.4)
Eos: 4 %
Hematocrit: 39.1 % (ref 34.0–46.6)
Hemoglobin: 12.3 g/dL (ref 11.1–15.9)
Immature Grans (Abs): 0 x10E3/uL (ref 0.0–0.1)
Immature Granulocytes: 0 %
Lymphocytes Absolute: 1.6 x10E3/uL (ref 0.7–3.1)
Lymphs: 21 %
MCH: 27.1 pg (ref 26.6–33.0)
MCHC: 31.5 g/dL (ref 31.5–35.7)
MCV: 86 fL (ref 79–97)
Monocytes Absolute: 0.7 x10E3/uL (ref 0.1–0.9)
Monocytes: 9 %
Neutrophils Absolute: 4.8 x10E3/uL (ref 1.4–7.0)
Neutrophils: 65 %
Platelets: 415 x10E3/uL (ref 150–450)
RBC: 4.54 x10E6/uL (ref 3.77–5.28)
RDW: 17 % — ABNORMAL HIGH (ref 11.7–15.4)
WBC: 7.6 x10E3/uL (ref 3.4–10.8)

## 2024-09-14 LAB — IRON,TIBC AND FERRITIN PANEL
Ferritin: 17 ng/mL (ref 15–150)
Iron Saturation: 8 % — CL (ref 15–55)
Iron: 38 ug/dL (ref 27–159)
Total Iron Binding Capacity: 484 ug/dL — ABNORMAL HIGH (ref 250–450)
UIBC: 446 ug/dL — ABNORMAL HIGH (ref 131–425)

## 2024-09-14 LAB — TSH+FREE T4
Free T4: 1.01 ng/dL (ref 0.82–1.77)
TSH: 5.17 u[IU]/mL — ABNORMAL HIGH (ref 0.450–4.500)

## 2024-09-14 LAB — LIPID PANEL WITH LDL/HDL RATIO
Cholesterol, Total: 209 mg/dL — ABNORMAL HIGH (ref 100–199)
HDL: 55 mg/dL (ref >39)
LDL Chol Calc (NIH): 132 mg/dL — ABNORMAL HIGH (ref 0–99)
LDL/HDL Ratio: 2.4 ratio (ref 0.0–3.2)
Triglycerides: 126 mg/dL (ref 0–149)
VLDL Cholesterol Cal: 22 mg/dL (ref 5–40)

## 2024-09-14 LAB — VITAMIN D 25 HYDROXY (VIT D DEFICIENCY, FRACTURES): Vit D, 25-Hydroxy: 29.4 ng/mL — ABNORMAL LOW (ref 30.0–100.0)

## 2024-09-14 LAB — B12 AND FOLATE PANEL
Folate: 17.4 ng/mL (ref >3.0)
Vitamin B-12: 774 pg/mL (ref 232–1245)

## 2024-09-17 ENCOUNTER — Ambulatory Visit: Payer: Self-pay | Admitting: Physician Assistant

## 2024-09-17 DIAGNOSIS — R7989 Other specified abnormal findings of blood chemistry: Secondary | ICD-10-CM

## 2024-09-17 NOTE — Telephone Encounter (Signed)
LVM for patient regarding labs 

## 2024-09-17 NOTE — Telephone Encounter (Signed)
-----   Message from Tinnie MARLA Pro sent at 09/17/2024 10:00 AM EST ----- Please let her know that her iron is very low and needs to supplement daily. Hey thyroid levels were also a little off and I will want to recheck these in about 2 months (ordered). Cholesterol  elevated and should work on this-avoid fried foods. Vit D low--OTC supplement. ----- Message ----- From: Interface, [facility] Lab Results In Sent: 09/13/2024   7:37 AM EST To: Tinnie MARLA Pro, PA-C

## 2024-09-26 ENCOUNTER — Telehealth: Payer: Self-pay | Admitting: Physician Assistant

## 2024-09-26 NOTE — Telephone Encounter (Signed)
 Lvm & sent message to schedule f/u appointment in May  of 2026-Toni

## 2025-09-15 ENCOUNTER — Encounter: Admitting: Physician Assistant
